# Patient Record
Sex: Male | Born: 1990 | Race: Black or African American | Hispanic: No | Marital: Single
Health system: Southern US, Community
[De-identification: ages and names within clinical notes are randomized; demographics above are authoritative.]

---
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
## 2019-02-09 ENCOUNTER — Other Ambulatory Visit: Payer: Self-pay

## 2019-02-09 DIAGNOSIS — Z20822 Contact with and (suspected) exposure to covid-19: Secondary | ICD-10-CM

## 2019-02-10 LAB — NOVEL CORONAVIRUS, NAA: SARS-CoV-2, NAA: NOT DETECTED

## 2019-02-11 ENCOUNTER — Telehealth: Payer: Self-pay | Admitting: General Practice

## 2019-02-11 NOTE — Telephone Encounter (Signed)
Negative COVID results given. Patient results "NOT Detected." Caller expressed understanding. ° °
